# Patient Record
Sex: Male | Born: 1995 | Race: White | Hispanic: No | Marital: Single | Smoking: Never smoker
Health system: Southern US, Community
[De-identification: ages and names within clinical notes are randomized; demographics above are authoritative.]

---
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
## 2013-03-18 ENCOUNTER — Emergency Department: Payer: Self-pay | Admitting: Emergency Medicine

## 2014-05-16 ENCOUNTER — Emergency Department: Payer: Self-pay | Admitting: Emergency Medicine

## 2017-09-28 ENCOUNTER — Other Ambulatory Visit: Payer: Self-pay | Admitting: Physician Assistant

## 2017-09-28 ENCOUNTER — Ambulatory Visit
Admission: RE | Admit: 2017-09-28 | Discharge: 2017-09-28 | Disposition: A | Discharge status: Home / Self Care | Payer: Worker's Compensation | Source: Ambulatory Visit | Attending: Physician Assistant | Admitting: Physician Assistant

## 2017-09-28 DIAGNOSIS — M7989 Other specified soft tissue disorders: Secondary | ICD-10-CM | POA: Diagnosis not present

## 2017-09-28 DIAGNOSIS — X58XXXA Exposure to other specified factors, initial encounter: Secondary | ICD-10-CM | POA: Diagnosis not present

## 2017-09-28 DIAGNOSIS — S92424A Nondisplaced fracture of distal phalanx of right great toe, initial encounter for closed fracture: Secondary | ICD-10-CM | POA: Diagnosis not present

## 2017-09-28 DIAGNOSIS — S97101A Crushing injury of unspecified right toe(s), initial encounter: Secondary | ICD-10-CM | POA: Diagnosis present

## 2019-07-12 ENCOUNTER — Ambulatory Visit: Payer: BC Managed Care – PPO | Attending: Internal Medicine

## 2019-07-12 DIAGNOSIS — Z23 Encounter for immunization: Secondary | ICD-10-CM

## 2019-07-12 NOTE — Progress Notes (Signed)
   Covid-19 Vaccination Clinic  Name:  Nathaniel Skinner    MRN: [id number] DOB: [birth year]  07/12/2019  Nathaniel Skinner was observed post Covid-19 immunization for 15 minutes without incident. He was provided with Vaccine Information Sheet and instruction to access the V-Safe system.   Nathaniel Skinner was instructed to call 911 with any severe reactions post vaccine: Marland Kitchen Difficulty breathing  . Swelling of face and throat  . A fast heartbeat  . A bad rash all over body  . Dizziness and weakness   Immunizations Administered    Name Date Dose VIS Date Route   Pfizer COVID-19 Vaccine 07/12/2019 11:56 AM 0.3 mL 03/15/2019 Intramuscular   Manufacturer: [employer], [employer]   Lot: [id number]   NDC: [address]-7

## 2019-08-06 ENCOUNTER — Ambulatory Visit: Payer: BC Managed Care – PPO | Attending: Internal Medicine

## 2019-08-06 DIAGNOSIS — Z23 Encounter for immunization: Secondary | ICD-10-CM

## 2019-08-06 NOTE — Progress Notes (Signed)
   Covid-19 Vaccination Clinic  Name:  TREMAR WICKENS    MRN: [id number] DOB: 1995/12/15  08/06/2019  Mr. Allbaugh was observed post Covid-19 immunization for 15 minutes without incident. He was provided with Vaccine Information Sheet and instruction to access the V-Safe system.   Mr. Atiyeh was instructed to call 911 with any severe reactions post vaccine: Marland Kitchen Difficulty breathing  . Swelling of face and throat  . A fast heartbeat  . A bad rash all over body  . Dizziness and weakness   Immunizations Administered    Name Date Dose VIS Date Route   Pfizer COVID-19 Vaccine 08/06/2019 11:16 AM 0.3 mL 05/29/2018 Intramuscular   Manufacturer: [employer], [employer]   Lot: [id number]   NDC: [address]-0

## 2020-01-12 IMAGING — CR DG TOE 2ND 2+V*R*
3 series · 3 of 3 positions shown · non-contrast
Comparison: None.

CLINICAL DATA: Crush injury yesterday with second toe pain, initial
encounter

EXAM:
RIGHT SECOND TOE

[toe ap]
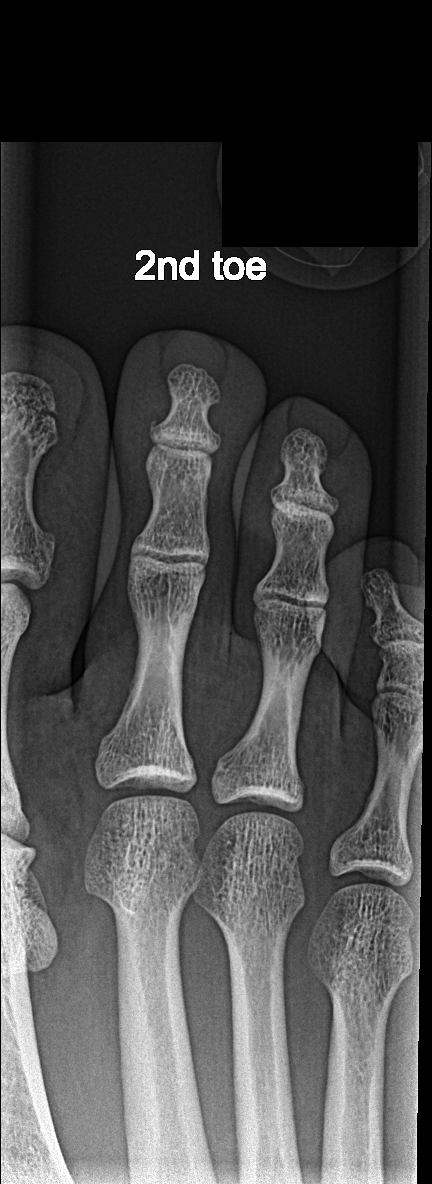

[toe obl]
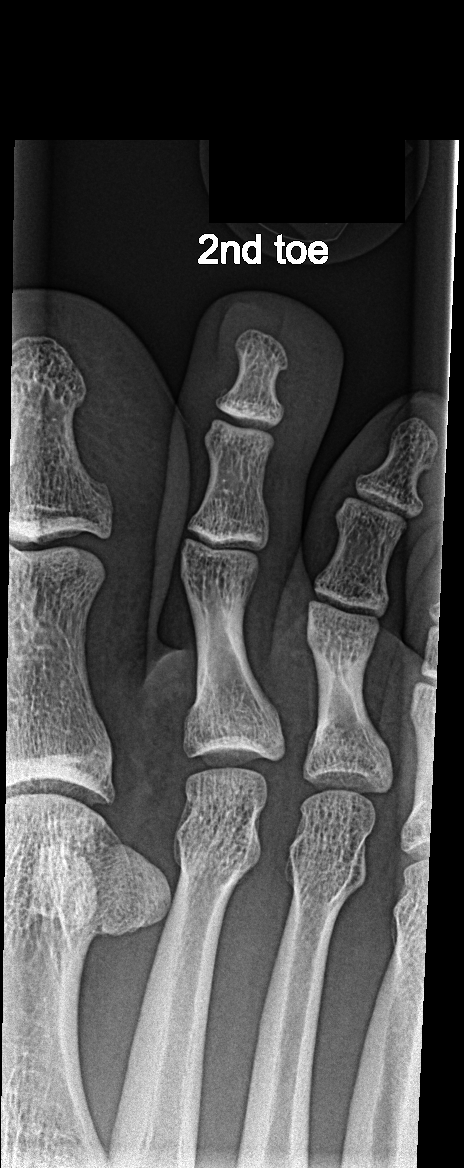

[toe lat]
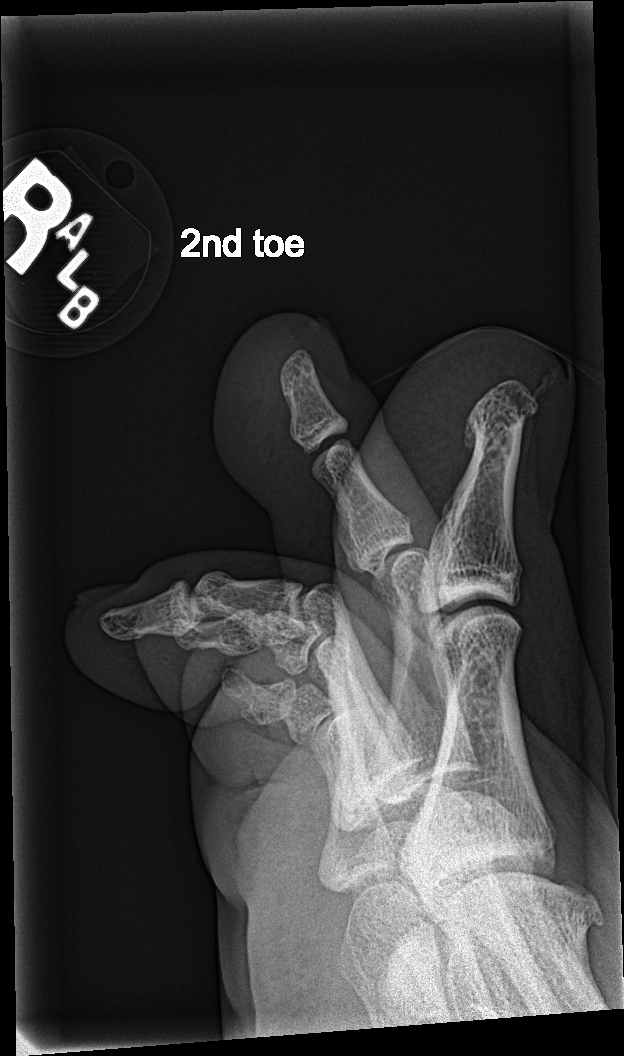

[3 of 3 positions shown; findings below may reference images not displayed]

FINDINGS: No acute fracture or dislocation is noted. Mild soft tissue swelling
is seen.
IMPRESSION: Soft tissue swelling without acute bony abnormality.

## 2022-06-14 ENCOUNTER — Encounter: Payer: Self-pay | Admitting: Emergency Medicine

## 2022-06-14 ENCOUNTER — Other Ambulatory Visit: Payer: Self-pay

## 2022-06-14 ENCOUNTER — Emergency Department
Admission: EM | Admit: 2022-06-14 | Discharge: 2022-06-14 | Disposition: A | Discharge status: Home / Self Care | Payer: BC Managed Care – PPO | Attending: Student in an Organized Health Care Education/Training Program | Admitting: Student in an Organized Health Care Education/Training Program

## 2022-06-14 DIAGNOSIS — R030 Elevated blood-pressure reading, without diagnosis of hypertension: Secondary | ICD-10-CM | POA: Diagnosis not present

## 2022-06-14 DIAGNOSIS — K602 Anal fissure, unspecified: Secondary | ICD-10-CM | POA: Insufficient documentation

## 2022-06-14 DIAGNOSIS — K625 Hemorrhage of anus and rectum: Secondary | ICD-10-CM | POA: Diagnosis present

## 2022-06-14 NOTE — ED Triage Notes (Signed)
Pt presents to the ED due to rectal bleeding. Pt has a hx of hemorrhoids. Pt states the bleeding was more than normal. Pt denies NVD. pT nad. Pt A&Ox4

## 2022-06-14 NOTE — Discharge Instructions (Signed)
Call make an appointment for follow-up with Dr. Marius Ditch.  Her phone number and address are listed on your discharge papers.  Also obtain stool softeners to prevent constipation which would make this worse.  On today's exam there is no external hemorrhoids noted.  There is 1 area that appears to be a small anal fissure which can also cause pain, discomfort and some bright red bleeding.  Return to the emergency department if any severe worsening of your symptoms or urgent concerns.

## 2022-06-14 NOTE — ED Provider Notes (Signed)
[facility] Provider Note    Event Date/Time   First MD Initiated Contact with Patient 06/14/22 1026     (approximate)   History   Rectal Bleeding   HPI  DAYTON ROPP is a 27 y.o. male   presents to the ED with complaint of bright red rectal bleeding.  Patient states he has a history of hemorrhoids.  He denies any black tarry stools or abdominal pain.  No prior history of GI bleeds and patient is not taking NSAIDs.      Physical Exam   Triage Vital Signs: ED Triage Vitals  Enc Vitals Group     BP 06/14/22 1013 (!) 148/109     Pulse Rate 06/14/22 1013 76     Resp 06/14/22 1013 18     Temp 06/14/22 1013 (!) 97.5 F (36.4 C)     Temp Source 06/14/22 1013 Oral     SpO2 06/14/22 1013 99 %     Weight 06/14/22 1013 (!) 313 lb (142 kg)     Height 06/14/22 1013 '6\' 1"'$  (1.854 m)     Head Circumference --      Peak Flow --      Pain Score 06/14/22 1011 2     Pain Loc --      Pain Edu? --      Excl. in [place]? --     Most recent vital signs: Vitals:   06/14/22 1013 06/14/22 1151  BP: (!) 148/109 (!) 143/90  Pulse: 76   Resp: 18   Temp: (!) 97.5 F (36.4 C)   SpO2: 99%      General: Awake, no distress.  CV:  Good peripheral perfusion.  Resp:  Normal effort.  Abd:  No distention.  Other:  Rectal exam shows a very small anal fissure at approximately 12 o'clock position.  No active bleeding at this time.  No external hemorrhoids are present.  Nontender  on palpation.   ED Results / Procedures / Treatments   Labs (all labs ordered are listed, but only abnormal results are displayed) Labs Reviewed - No data to display    PROCEDURES:  Critical Care performed:   Procedures   MEDICATIONS ORDERED IN ED: Medications - No data to display   IMPRESSION / MDM / [place] / ED COURSE  I reviewed the triage vital signs and the nursing notes.   Differential diagnosis includes, but is not limited to, external hemorrhoid,  thrombosed hemorrhoid, anal fissure, internal hemorrhoid.  27 year old male presents to the ED with complaint of bright red rectal bleeding and history of hemorrhoids.  On exam there is a small anal fissure without active bleeding at this time.  No external hemorrhoids were noted.  Patient was advised to follow-up with the gastroenterologist listed on his discharge papers if he continues to see bright red blood as he may have internal hemorrhoids and will need further evaluation and/or treatment.  At this time he agrees to get some stool softeners.  He is to return to the emergency department if any severe worsening of his symptoms.      Patient's presentation is most consistent with acute complicated illness / injury requiring diagnostic workup.  FINAL CLINICAL IMPRESSION(S) / ED DIAGNOSES   Final diagnoses:  Anal fissure  Elevated blood pressure reading     Rx / DC Orders   ED Discharge Orders     None        Note:  This document was  prepared using Systems analyst and may include unintentional dictation errors.   Johnn Hai, PA-C 06/14/22 1424    Merlyn Lot, MD 06/14/22 [phone]

## 2022-06-16 ENCOUNTER — Other Ambulatory Visit: Payer: Self-pay

## 2022-06-22 ENCOUNTER — Ambulatory Visit: Payer: BC Managed Care – PPO | Admitting: Gastroenterology

## 2022-10-30 ENCOUNTER — Other Ambulatory Visit: Payer: Self-pay

## 2022-10-30 ENCOUNTER — Emergency Department
Admission: EM | Admit: 2022-10-30 | Discharge: 2022-10-30 | Disposition: A | Discharge status: Home / Self Care | Payer: BC Managed Care – PPO | Attending: Emergency Medicine | Admitting: Emergency Medicine

## 2022-10-30 DIAGNOSIS — Z1152 Encounter for screening for COVID-19: Secondary | ICD-10-CM | POA: Insufficient documentation

## 2022-10-30 DIAGNOSIS — K529 Noninfective gastroenteritis and colitis, unspecified: Secondary | ICD-10-CM | POA: Insufficient documentation

## 2022-10-30 DIAGNOSIS — R509 Fever, unspecified: Secondary | ICD-10-CM | POA: Diagnosis present

## 2022-10-30 DIAGNOSIS — A084 Viral intestinal infection, unspecified: Secondary | ICD-10-CM

## 2022-10-30 LAB — COMPREHENSIVE METABOLIC PANEL
ALT: 39 U/L (ref 0–44)
AST: 28 U/L (ref 15–41)
Albumin: 4.6 g/dL (ref 3.5–5.0)
Alkaline Phosphatase: 61 U/L (ref 38–126)
Anion gap: 10 (ref 5–15)
BUN: 15 mg/dL (ref 6–20)
CO2: 22 mmol/L (ref 22–32)
Calcium: 9.2 mg/dL (ref 8.9–10.3)
Chloride: 105 mmol/L (ref 98–111)
Creatinine, Ser: 1.34 mg/dL — ABNORMAL HIGH (ref 0.61–1.24)
GFR, Estimated: >60 mL/min (ref >60)
Glucose, Bld: 97 mg/dL (ref 70–99)
Potassium: 3.7 mmol/L (ref 3.5–5.1)
Sodium: 137 mmol/L (ref 135–145)
Total Bilirubin: 0.7 mg/dL (ref 0.3–1.2)
Total Protein: 8.2 g/dL — ABNORMAL HIGH (ref 6.5–8.1)

## 2022-10-30 LAB — URINALYSIS, ROUTINE W REFLEX MICROSCOPIC
Bacteria, UA: NONE SEEN
Bilirubin Urine: NEGATIVE
Glucose, UA: NEGATIVE mg/dL
Hgb urine dipstick: NEGATIVE
Ketones, ur: NEGATIVE mg/dL
Leukocytes,Ua: NEGATIVE
Nitrite: NEGATIVE
Protein, ur: 30 mg/dL — AB
Specific Gravity, Urine: 1.032 — ABNORMAL HIGH (ref 1.005–1.030)
pH: 5 (ref 5.0–8.0)

## 2022-10-30 LAB — SARS CORONAVIRUS 2 BY RT PCR: SARS Coronavirus 2 by RT PCR: NEGATIVE

## 2022-10-30 LAB — CBC
HCT: 49.5 % (ref 39.0–52.0)
Hemoglobin: 17.3 g/dL — ABNORMAL HIGH (ref 13.0–17.0)
MCH: 29.4 pg (ref 26.0–34.0)
MCHC: 34.9 g/dL (ref 30.0–36.0)
MCV: 84 fL (ref 80.0–100.0)
Platelets: 260 10*3/uL (ref 150–400)
RBC: 5.89 MIL/uL — ABNORMAL HIGH (ref 4.22–5.81)
RDW: 12.4 % (ref 11.5–15.5)
WBC: 4.1 10*3/uL (ref 4.0–10.5)
nRBC: 0 % (ref 0.0–0.2)

## 2022-10-30 LAB — LIPASE, BLOOD: Lipase: 31 U/L (ref 11–51)

## 2022-10-30 MED ORDER — ONDANSETRON 4 MG PO TBDP
4.0000 mg | ORAL_TABLET | Freq: Once | ORAL | Status: AC
  Administered 2022-10-30: 4 mg via ORAL
  Filled 2022-10-30: qty 1

## 2022-10-30 MED ORDER — ONDANSETRON 4 MG PO TBDP: 4.0000 mg | ORAL_TABLET | Freq: Three times a day (TID) | ORAL | 0 refills | Status: AC | PRN

## 2022-10-30 NOTE — ED Notes (Signed)
Patient to ED via POV from [facility] for RUQ abd pain with fever and diarrhea. Ambulatory to waiting room.

## 2022-10-30 NOTE — ED Triage Notes (Signed)
Pt states coming in with a fever and nausea that started yesterday. Pt states he also has bilateral eye pain. Pt denies vomiting.

## 2022-10-30 NOTE — Discharge Instructions (Addendum)
You are seen in the emergency department for fever associated with nausea, vomiting and diarrhea.  Your lab work showed mild signs of dehydration.  Your liver function test and your pancreas numbers were normal.  You have no findings of urinary tract infection.  COVID was obtained and you should be able to check your results on MyChart.  If your COVID is positive it is important they quarantine for 5 days.  Stay hydrated and drink plenty of fluids.  You are given a prescription for nausea medication.  Return to the emergency department if you have ongoing symptoms, dehydration or return of abdominal pain. Try to stop smoking/vaping :)   Pain control:  Ibuprofen (motrin/aleve/advil) - You can take 3 tablets (600 mg) every 6 hours as needed for pain/fever.  Acetaminophen (tylenol) - You can take 2 extra strength tablets (1000 mg) every 6 hours as needed for pain/fever.  You can alternate these medications or take them together.  Make sure you eat food/drink water when taking these medications.

## 2022-10-30 NOTE — ED Provider Notes (Signed)
[facility] Provider Note    Event Date/Time   First MD Initiated Contact with Patient 10/30/22 1541     (approximate)   History   Fever and Abdominal Pain   HPI  Nathaniel Skinner is a 27 y.o. male presents to the emergency department with nausea, vomiting and diarrhea.  States that he started to not feel well yesterday.  Denies any ongoing abdominal pain at this time.  Fever yesterday and today.  Multiple episodes of nausea, vomiting and diarrhea.  Denies any blood in his stool.  No recent travel.  No cough or shortness of breath.  No dysuria, urinary urgency or frequency.  Did have some mild back pain yesterday.  Recently on doxycycline for a spider bite.  No tick exposure or rashes.     Physical Exam   Triage Vital Signs: ED Triage Vitals  Encounter Vitals Group     BP 10/30/22 1443 (!) 146/83     Systolic BP Percentile --      Diastolic BP Percentile --      Pulse Rate 10/30/22 1443 100     Resp 10/30/22 1443 18     Temp 10/30/22 1443 98.6 F (37 C)     Temp src --      SpO2 10/30/22 1443 95 %     Weight 10/30/22 1444 298 lb (135.2 kg)     Height 10/30/22 1444 6' (1.829 m)     Head Circumference --      Peak Flow --      Pain Score 10/30/22 1444 4     Pain Loc --      Pain Education --      Exclude from Growth Chart --     Most recent vital signs: Vitals:   10/30/22 1443  BP: (!) 146/83  Pulse: 100  Resp: 18  Temp: 98.6 F (37 C)  SpO2: 95%    Physical Exam Constitutional:      Appearance: He is well-developed. He is obese.  HENT:     Head: Atraumatic.  Eyes:     Conjunctiva/sclera: Conjunctivae normal.  Cardiovascular:     Rate and Rhythm: Regular rhythm.  Pulmonary:     Effort: No respiratory distress.  Abdominal:     Tenderness: There is abdominal tenderness (Mild focal tenderness to the right flank.  No right upper quadrant abdominal tenderness to palpation.  Negative Murphy sign.  No right lower quadrant abdominal  tenderness.  Negative McBurney's point.). There is no right CVA tenderness, left CVA tenderness or rebound. Negative signs include Murphy's sign and McBurney's sign.  Musculoskeletal:     Cervical back: Normal range of motion.  Skin:    General: Skin is warm.     Capillary Refill: Capillary refill takes less than 2 seconds.  Neurological:     Mental Status: He is alert. Mental status is at baseline.      IMPRESSION / MDM / ASSESSMENT AND PLAN / ED COURSE  I reviewed the triage vital signs and the nursing notes.  Differential diagnosis including viral gastroenteritis, COVID, urinary tract infection, pyelonephritis, viral illness.  No recent travel.  No rashes or tick bites.  Clinical picture is not consistent with a [facility] spotted fever.  Abdomen is currently nontender to palpation.  No right lower quadrant abdominal tenderness.  I have a lower suspicion for acute appendicitis.  No right upper quadrant abdominal tenderness, clinical picture is not consistent with acute cholecystitis.  Afebrile on  arrival to the emergency department  Labs (all labs ordered are listed, but only abnormal results are displayed) Labs interpreted as -    Labs Reviewed  COMPREHENSIVE METABOLIC PANEL - Abnormal; Notable for the following components:      Result Value   Creatinine, Ser 1.34 (*)    Total Protein 8.2 (*)    All other components within normal limits  CBC - Abnormal; Notable for the following components:   RBC 5.89 (*)    Hemoglobin 17.3 (*)    All other components within normal limits  URINALYSIS, ROUTINE W REFLEX MICROSCOPIC - Abnormal; Notable for the following components:   Color, Urine AMBER (*)    APPearance HAZY (*)    Specific Gravity, Urine 1.032 (*)    Protein, ur 30 (*)    All other components within normal limits  SARS CORONAVIRUS 2 BY RT PCR  LIPASE, BLOOD    Patient with no leukocytosis.  Creatinine 1.34 which may be elevated to the patient, do not have a prior  creatinine to compare.  No significant electrolyte abnormalities.  Normal BUN.  Lipase within normal limits.  Normal LFTs.  Mildly elevated hemoglobin which could be in the setting of dehydration.  No signs of urinary tract infection.  Platelets within normal limits.  Patient was given Zofran.  COVID testing obtained.  Most likely with a viral gastroenteritis versus COVID.  Discussed increasing oral hydration, patient states he has not really drink anything today.  Will give prescription for Zofran.  Discussed close follow-up with primary care physician and given a referral for PCP.  Discussed if his fever continued or if he had worsening signs of dehydration or return of abdominal pain to return to the emergency department for reevaluation.  Patient expressed understanding.  No questions at time of discharge.     PROCEDURES:  Critical Care performed: No  Procedures  Patient's presentation is most consistent with acute illness / injury with system symptoms.   MEDICATIONS ORDERED IN ED: Medications  ondansetron (ZOFRAN-ODT) disintegrating tablet 4 mg (4 mg Oral Given 10/30/22 1604)    FINAL CLINICAL IMPRESSION(S) / ED DIAGNOSES   Final diagnoses:  Viral gastroenteritis     Rx / DC Orders   ED Discharge Orders          Ordered    Ambulatory Referral to Primary Care (Establish Care)        10/30/22 1559    ondansetron (ZOFRAN-ODT) 4 MG disintegrating tablet  Every 8 hours PRN        10/30/22 1600             Note:  This document was prepared using Dragon voice recognition software and may include unintentional dictation errors.   Corena Herter, MD 10/30/22 1606
